# Patient Record
Sex: Female | Born: 1970 | Race: Black or African American | Hispanic: No | Marital: Married | Smoking: Never smoker
Health system: Southern US, Community
[De-identification: ages and names within clinical notes are randomized; demographics above are authoritative.]

## PROBLEM LIST (undated history)

## (undated) DIAGNOSIS — I10 Essential (primary) hypertension: Secondary | ICD-10-CM

## (undated) DIAGNOSIS — U071 COVID-19: Secondary | ICD-10-CM

## (undated) DIAGNOSIS — E7800 Pure hypercholesterolemia, unspecified: Secondary | ICD-10-CM

## (undated) DIAGNOSIS — M419 Scoliosis, unspecified: Secondary | ICD-10-CM

## (undated) DIAGNOSIS — E119 Type 2 diabetes mellitus without complications: Secondary | ICD-10-CM

## (undated) HISTORY — PX: TONSILLECTOMY: SUR1361

## (undated) HISTORY — PX: TUBAL LIGATION: SHX77

---

## 2021-04-04 ENCOUNTER — Other Ambulatory Visit: Payer: Self-pay

## 2021-04-04 ENCOUNTER — Encounter (HOSPITAL_BASED_OUTPATIENT_CLINIC_OR_DEPARTMENT_OTHER): Payer: Self-pay

## 2021-04-04 ENCOUNTER — Emergency Department (HOSPITAL_BASED_OUTPATIENT_CLINIC_OR_DEPARTMENT_OTHER): Payer: 59

## 2021-04-04 ENCOUNTER — Emergency Department (HOSPITAL_BASED_OUTPATIENT_CLINIC_OR_DEPARTMENT_OTHER)
Admission: EM | Admit: 2021-04-04 | Discharge: 2021-04-04 | Disposition: A | Discharge status: Home / Self Care | Payer: 59 | Attending: Emergency Medicine | Admitting: Emergency Medicine

## 2021-04-04 DIAGNOSIS — I10 Essential (primary) hypertension: Secondary | ICD-10-CM | POA: Insufficient documentation

## 2021-04-04 DIAGNOSIS — Z8616 Personal history of COVID-19: Secondary | ICD-10-CM | POA: Insufficient documentation

## 2021-04-04 DIAGNOSIS — R200 Anesthesia of skin: Secondary | ICD-10-CM

## 2021-04-04 DIAGNOSIS — R202 Paresthesia of skin: Secondary | ICD-10-CM | POA: Insufficient documentation

## 2021-04-04 DIAGNOSIS — R519 Headache, unspecified: Secondary | ICD-10-CM | POA: Diagnosis present

## 2021-04-04 DIAGNOSIS — E119 Type 2 diabetes mellitus without complications: Secondary | ICD-10-CM | POA: Insufficient documentation

## 2021-04-04 HISTORY — DX: Scoliosis, unspecified: M41.9

## 2021-04-04 HISTORY — DX: COVID-19: U07.1

## 2021-04-04 HISTORY — DX: Type 2 diabetes mellitus without complications: E11.9

## 2021-04-04 HISTORY — DX: Essential (primary) hypertension: I10

## 2021-04-04 HISTORY — DX: Pure hypercholesterolemia, unspecified: E78.00

## 2021-04-04 LAB — BASIC METABOLIC PANEL
Anion gap: 6 (ref 5–15)
BUN: 14 mg/dL (ref 6–20)
CO2: 27 mmol/L (ref 22–32)
Calcium: 8.9 mg/dL (ref 8.9–10.3)
Chloride: 107 mmol/L (ref 98–111)
Creatinine, Ser: 0.9 mg/dL (ref 0.44–1.00)
GFR, Estimated: >60 mL/min (ref >60)
Glucose, Bld: 143 mg/dL — ABNORMAL HIGH (ref 70–99)
Potassium: 3.6 mmol/L (ref 3.5–5.1)
Sodium: 140 mmol/L (ref 135–145)

## 2021-04-04 LAB — CBC WITH DIFFERENTIAL/PLATELET
Abs Immature Granulocytes: 0.02 10*3/uL (ref 0.00–0.07)
Basophils Absolute: 0 10*3/uL (ref 0.0–0.1)
Basophils Relative: 0 %
Eosinophils Absolute: 0.1 10*3/uL (ref 0.0–0.5)
Eosinophils Relative: 1 %
HCT: 36.6 % (ref 36.0–46.0)
Hemoglobin: 13.1 g/dL (ref 12.0–15.0)
Immature Granulocytes: 0 %
Lymphocytes Relative: 39 %
Lymphs Abs: 1.9 10*3/uL (ref 0.7–4.0)
MCH: 31.8 pg (ref 26.0–34.0)
MCHC: 35.8 g/dL (ref 30.0–36.0)
MCV: 88.8 fL (ref 80.0–100.0)
Monocytes Absolute: 0.3 10*3/uL (ref 0.1–1.0)
Monocytes Relative: 7 %
Neutro Abs: 2.6 10*3/uL (ref 1.7–7.7)
Neutrophils Relative %: 53 %
Platelets: 196 10*3/uL (ref 150–400)
RBC: 4.12 MIL/uL (ref 3.87–5.11)
RDW: 11.9 % (ref 11.5–15.5)
WBC: 4.9 10*3/uL (ref 4.0–10.5)
nRBC: 0 % (ref 0.0–0.2)

## 2021-04-04 LAB — TROPONIN I (HIGH SENSITIVITY): Troponin I (High Sensitivity): 4 ng/L (ref <18)

## 2021-04-04 MED ORDER — IOHEXOL 350 MG/ML SOLN
100.0000 mL | Freq: Once | INTRAVENOUS | Status: AC | PRN
  Administered 2021-04-04: 100 mL via INTRAVENOUS

## 2021-04-04 NOTE — ED Notes (Signed)
Patient reports headache with blurry vision yesterday (now resolved), numbness to left arm and leg starting yesterday.  Grips equal bilaterally, speech is clear, no facial droop present.

## 2021-04-04 NOTE — ED Notes (Signed)
No second troponin needed per Dr. Rubin Payor

## 2021-04-04 NOTE — ED Notes (Signed)
Patient transported to CT 

## 2021-04-04 NOTE — ED Provider Notes (Signed)
  Physical Exam  BP (!) 148/96   Pulse (!) 48   Temp 98.4 F (36.9 C) (Oral)   Resp 19   Ht 5\' 5"  (1.651 m)   Wt 87.1 kg   SpO2 100%   BMI 31.95 kg/m   Physical Exam  ED Course/Procedures     Procedures  MDM  Received patient signout.  Headache and some left arm findings.  Benign exam.  CTA done and showed possible small aneurysm.  Discussed with Dr. .  No immediate interaction needed.  Doubt bleeding.  Doubt acute stroke.       Conchita Paris, MD 04/04/21 2352

## 2021-04-04 NOTE — ED Provider Notes (Signed)
[facility] EMERGENCY DEPARTMENT Provider Note   CSN: [id number] Arrival date & time: 04/04/21  1208     History Chief Complaint  Patient presents with   Headache    Whitney Hodges is a 50 y.o. female.  Presents to ER with concern for multiple complaints.  Symptoms ongoing for the past day or so.  Initially noted headache and numb/tingling sensation down her left arm.  Also had an episode yesterday where she felt like she had a difficult time concentrating and her vision seemed blurred.  No change in her vision at present.  Headache is minimal at present.  Has been able to walk without difficulty.  No speech changes.  No neck stiffness or neck pain.  No fevers or chills.  Went to urgent care this morning it was recommended she go to ER for further evaluation.  Additional history obtained from chart review, review of care everywhere, urgent care visit.  HPI     Past Medical History:  Diagnosis Date   COVID    Diabetes mellitus without complication (HCC)    High cholesterol    Hypertension    Scoliosis     There are no problems to display for this patient.   Past Surgical History:  Procedure Laterality Date   CESAREAN SECTION     TONSILLECTOMY     TUBAL LIGATION       OB History   No obstetric history on file.     No family history on file.  Social History   Tobacco Use   Smoking status: Never   Smokeless tobacco: Never  Substance Use Topics   Alcohol use: Never   Drug use: Never    Home Medications Prior to Admission medications   Not on File    Allergies    Codeine  Review of Systems   Review of Systems  Constitutional:  Negative for chills and fever.  HENT:  Negative for ear pain and sore throat.   Eyes:  Negative for pain and visual disturbance.  Respiratory:  Negative for cough and shortness of breath.   Cardiovascular:  Negative for chest pain and palpitations.  Gastrointestinal:  Negative for abdominal pain and vomiting.   Genitourinary:  Negative for dysuria and hematuria.  Musculoskeletal:  Positive for arthralgias. Negative for back pain.  Skin:  Negative for color change and rash.  Neurological:  Positive for numbness and headaches. Negative for seizures and syncope.  All other systems reviewed and are negative.  Physical Exam Updated Vital Signs BP (!) 136/93   Pulse (!) 48   Temp 99.1 F (37.3 C) (Oral)   Resp 20   Ht 5\' 5"  (1.651 m)   Wt 87.1 kg   SpO2 98%   BMI 31.95 kg/m   Physical Exam Vitals and nursing note reviewed.  Constitutional:      General: She is not in acute distress.    Appearance: She is well-developed.  HENT:     Head: Normocephalic and atraumatic.  Eyes:     Conjunctiva/sclera: Conjunctivae normal.  Cardiovascular:     Rate and Rhythm: Normal rate and regular rhythm.     Heart sounds: No murmur heard. Pulmonary:     Effort: Pulmonary effort is normal. No respiratory distress.     Breath sounds: Normal breath sounds.  Abdominal:     Palpations: Abdomen is soft.     Tenderness: There is no abdominal tenderness.  Musculoskeletal:     Cervical back: Neck supple.  Skin:  General: Skin is warm and dry.  Neurological:     Comments: AAOx3 CN 2-12 intact, speech clear visual fields intact 5/5 strength in b/l UE and LE Sensation to light touch intact in b/l UE and LE Normal FNF Normal gait  Psychiatric:        Mood and Affect: Mood normal.    ED Results / Procedures / Treatments   Labs (all labs ordered are listed, but only abnormal results are displayed) Labs Reviewed  BASIC METABOLIC PANEL - Abnormal; Notable for the following components:      Result Value   Glucose, Bld 143 (*)    All other components within normal limits  CBC WITH DIFFERENTIAL/PLATELET  TROPONIN I (HIGH SENSITIVITY)  TROPONIN I (HIGH SENSITIVITY)    EKG EKG Interpretation  Date/Time:  Tuesday April 04 2021 14:03:58 EDT Ventricular Rate:  50 PR Interval:  177 QRS  Duration: 86 QT Interval:  426 QTC Calculation: 389 R Axis:   66 Text Interpretation: Sinus rhythm Confirmed by Marianna Fuss ([id number]) on 04/04/2021 3:36:38 PM  RadiologyProcedures Procedures   Medications Ordered in ED Medications  iohexol (OMNIPAQUE) 350 MG/ML injection 100 mL (100 mLs Intravenous Contrast Given 04/04/21 1427)    ED Course  I have reviewed the triage vital signs and the nursing notes.  Pertinent labs & imaging results that were available during my care of the patient were reviewed by me and considered in my medical decision making (see chart for details).    MDM Rules/Calculators/A&P                           50 year old lady presented to ER with concern for headache, left arm numb/tingly sensation as well as transient vision changes.  On exam patient appears well in no distress, she currently has no focal neurologic deficits on my exam.  We will check CTA head/neck to evaluate further.  Will check basic labs as well as EKG and troponin given the left arm complaint.  While awaiting results, signed out to Dr. Rubin Payor.  Final Clinical Impression(s) / ED Diagnoses Final diagnoses:  Nonintractable headache, unspecified chronicity pattern, unspecified headache type  Numbness and tingling of left upper extremity    Rx / DC Orders ED Discharge Orders     None        Milagros Loll, MD 04/04/21 1537

## 2021-04-04 NOTE — ED Triage Notes (Signed)
Pt c/o HA, vision changes, pain/numbness to UE and LE and left side of face-sx started yesterday-denies head/neck injury-seen/sent from PCP office x today-NAD-steady gait

## 2021-04-04 NOTE — Discharge Instructions (Addendum)
Follow-up with your primary care doctor for the numbness of the left arm and potential swelling in your mouth.  Follow-up with Dr. Conchita Paris for the small aneurysm found on the CTA.

## 2023-08-01 ENCOUNTER — Other Ambulatory Visit (HOSPITAL_COMMUNITY): Payer: Self-pay | Admitting: Neurosurgery

## 2023-08-01 DIAGNOSIS — I671 Cerebral aneurysm, nonruptured: Secondary | ICD-10-CM

## 2023-08-05 IMAGING — CT CT ANGIO HEAD
1 of 11 series · 5 of 33 positions shown · IV contrast (Omnipaque)
Comparison: None.

CLINICAL DATA: Neuro deficit, acute, stroke suspected

EXAM:
CT ANGIOGRAPHY HEAD AND NECK
TECHNIQUE: Multidetector CT imaging of the head and neck was performed using
the standard protocol during bolus administration of intravenous
contrast. Multiplanar CT image reconstructions and MIPs were
obtained to evaluate the vascular anatomy. Carotid stenosis
measurements (when applicable) are obtained utilizing NASCET
criteria, using the distal internal carotid diameter as the
denominator.
CONTRAST:  100mL OMNIPAQUE IOHEXOL 350 MG/ML SOLN

[Series 11: axial thin · axial · 0.47mm/px · z∈[+923,+1141]mm · 5 of 328 slices shown]
[im 55/328  soft-tissue]
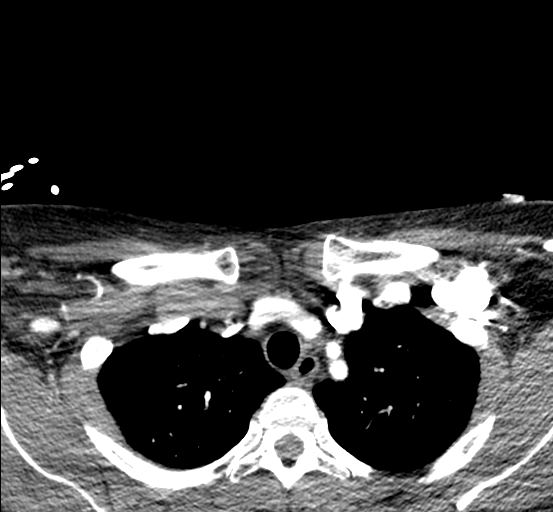
[im 110/328  bone]
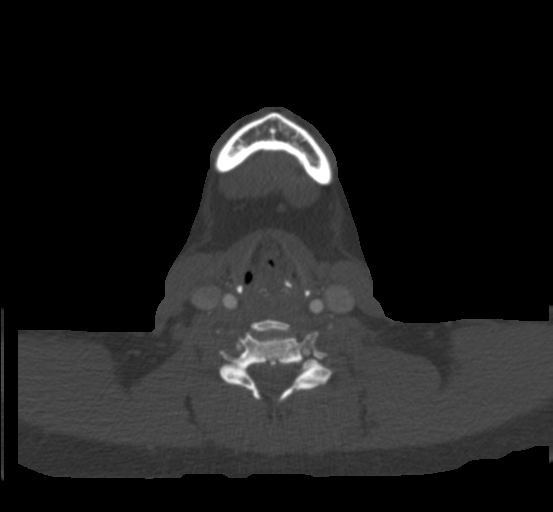
[im 164/328  soft-tissue]
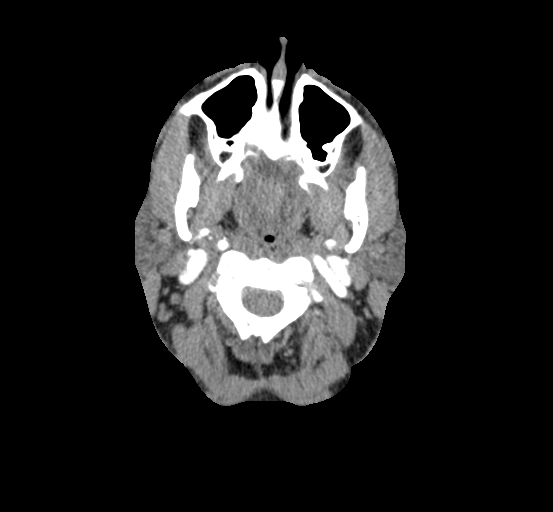
[im 219/328  bone]
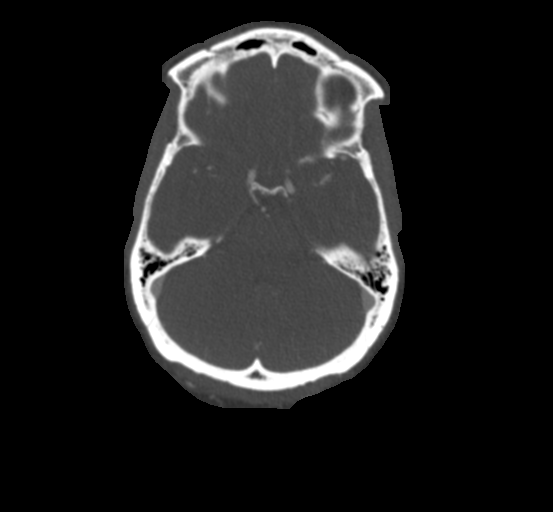
[im 273/328  soft-tissue]
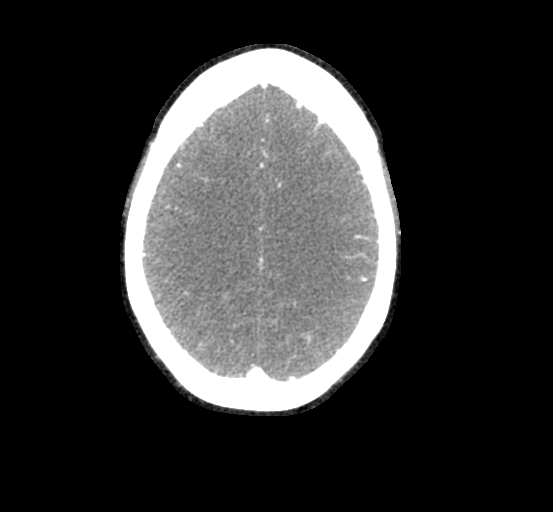

[5 of 33 positions shown; findings below may reference images not displayed]

FINDINGS: CT HEAD FINDINGS

Brain: No evidence of acute large vascular territory infarction,
hemorrhage, hydrocephalus, extra-axial collection or mass
lesion/mass effect.

Vascular: See below.

Skull: No acute fracture.

Sinuses: Visualized sinuses are clear.

Orbits: No acute orbital findings.

Review of the MIP images confirms the above findings

CTA NECK FINDINGS

Aortic arch: Great vessel origins are patent.

Right carotid system: No evidence of dissection, stenosis (50% or
greater) or occlusion.

Left carotid system: No evidence of dissection, stenosis (50% or
greater) or occlusion.

Vertebral arteries: Left dominant old. No evidence of dissection,
stenosis (50% or greater) or occlusion.

Skeleton: Mild lower cervical degenerative disc disease.

Other neck: Mild asymmetric prominence of the left
tonsil/glossotonsillar sulcus/tongue base.

Upper chest: Visualized lung apices are clear.

Review of the MIP images confirms the above findings

CTA HEAD FINDINGS

Anterior circulation: Bilateral intracranial ICAs, MCAs, and ACAs
are patent without proximal hemodynamically significant stenosis.
Approximately 3 mm superiorly directed left supraclinoid ICA
outpouching (see series 13, image 132; series 11, image 223). The
base of this outpouching is approximally 2-3 mm wide.

Posterior circulation: Bilateral intradural vertebral arteries,
basilar artery, and posterior cerebral arteries are patent without
proximal hemodynamically significant stenosis. Bilateral posterior
communicating arteries are present with small P1 PCAs, anatomic
variant. No aneurysm identified.

Venous sinuses: As permitted by contrast timing, patent.

Anatomic variants: See above.

Review of the MIP images confirms the above findings
IMPRESSION: 1. No large vessel occlusion or proximal hemodynamically significant
stenosis.
2. Approximately 3 mm superiorly directed left supraclinoid ICA
aneurysm versus infundibulum with vessel not seen by CTA.
3. Mild asymmetric prominence of the left tonsil/glossotonsillar
sulcus/tongue base. This finding is nonspecific, but recommend
correlation with direct inspection to exclude malignancy in this
region.

## 2024-07-27 ENCOUNTER — Encounter: Payer: Self-pay | Admitting: *Deleted
